# Patient Record
Sex: Female | Born: 1975 | Race: Black or African American | Hispanic: No | Marital: Single | Smoking: Current every day smoker
Health system: Southern US, Community
[De-identification: ages and names within clinical notes are randomized; demographics above are authoritative.]

---

## 1997-06-06 ENCOUNTER — Inpatient Hospital Stay (HOSPITAL_COMMUNITY): Admission: AD | Admit: 1997-06-06 | Discharge: 1997-06-06 | Payer: Self-pay | Admitting: Obstetrics

## 1997-08-06 ENCOUNTER — Inpatient Hospital Stay (HOSPITAL_COMMUNITY): Admission: AD | Admit: 1997-08-06 | Discharge: 1997-08-06 | Payer: Self-pay | Admitting: Obstetrics & Gynecology

## 1999-03-04 ENCOUNTER — Inpatient Hospital Stay (HOSPITAL_COMMUNITY): Admission: AD | Admit: 1999-03-04 | Discharge: 1999-03-04 | Payer: Self-pay | Admitting: Obstetrics & Gynecology

## 1999-04-06 ENCOUNTER — Other Ambulatory Visit: Admission: RE | Admit: 1999-04-06 | Discharge: 1999-04-06 | Payer: Self-pay | Admitting: Obstetrics

## 1999-04-24 ENCOUNTER — Emergency Department (HOSPITAL_COMMUNITY): Admission: EM | Admit: 1999-04-24 | Discharge: 1999-04-24 | Payer: Self-pay | Admitting: Emergency Medicine

## 1999-06-22 ENCOUNTER — Encounter: Payer: Self-pay | Admitting: Emergency Medicine

## 1999-06-22 ENCOUNTER — Emergency Department (HOSPITAL_COMMUNITY): Admission: EM | Admit: 1999-06-22 | Discharge: 1999-06-22 | Payer: Self-pay | Admitting: Emergency Medicine

## 1999-07-19 ENCOUNTER — Emergency Department (HOSPITAL_COMMUNITY): Admission: EM | Admit: 1999-07-19 | Discharge: 1999-07-19 | Payer: Self-pay | Admitting: Emergency Medicine

## 1999-07-19 ENCOUNTER — Encounter: Payer: Self-pay | Admitting: Emergency Medicine

## 1999-08-29 ENCOUNTER — Encounter: Payer: Self-pay | Admitting: Internal Medicine

## 1999-08-29 ENCOUNTER — Emergency Department (HOSPITAL_COMMUNITY): Admission: EM | Admit: 1999-08-29 | Discharge: 1999-08-29 | Payer: Self-pay

## 1999-09-03 ENCOUNTER — Emergency Department (HOSPITAL_COMMUNITY): Admission: EM | Admit: 1999-09-03 | Discharge: 1999-09-03 | Payer: Self-pay | Admitting: Emergency Medicine

## 1999-09-03 ENCOUNTER — Encounter: Payer: Self-pay | Admitting: Emergency Medicine

## 2003-01-08 ENCOUNTER — Emergency Department (HOSPITAL_COMMUNITY): Admission: EM | Admit: 2003-01-08 | Discharge: 2003-01-08 | Payer: Self-pay | Admitting: Emergency Medicine

## 2003-03-18 ENCOUNTER — Emergency Department (HOSPITAL_COMMUNITY): Admission: EM | Admit: 2003-03-18 | Discharge: 2003-03-18 | Payer: Self-pay | Admitting: Emergency Medicine

## 2003-04-25 ENCOUNTER — Emergency Department (HOSPITAL_COMMUNITY): Admission: EM | Admit: 2003-04-25 | Discharge: 2003-04-25 | Payer: Self-pay | Admitting: Emergency Medicine

## 2003-08-20 ENCOUNTER — Emergency Department (HOSPITAL_COMMUNITY): Admission: EM | Admit: 2003-08-20 | Discharge: 2003-08-20 | Payer: Self-pay | Admitting: Emergency Medicine

## 2004-01-09 ENCOUNTER — Emergency Department (HOSPITAL_COMMUNITY): Admission: EM | Admit: 2004-01-09 | Discharge: 2004-01-09 | Payer: Self-pay | Admitting: Emergency Medicine

## 2004-01-27 ENCOUNTER — Emergency Department (HOSPITAL_COMMUNITY): Admission: EM | Admit: 2004-01-27 | Discharge: 2004-01-27 | Payer: Self-pay | Admitting: Family Medicine

## 2004-01-27 ENCOUNTER — Ambulatory Visit (HOSPITAL_COMMUNITY): Admission: RE | Admit: 2004-01-27 | Discharge: 2004-01-27 | Payer: Self-pay | Admitting: Family Medicine

## 2004-03-08 ENCOUNTER — Ambulatory Visit: Payer: Self-pay | Admitting: Internal Medicine

## 2004-06-20 ENCOUNTER — Emergency Department (HOSPITAL_COMMUNITY): Admission: EM | Admit: 2004-06-20 | Discharge: 2004-06-20 | Payer: Self-pay | Admitting: Emergency Medicine

## 2004-07-28 ENCOUNTER — Inpatient Hospital Stay (HOSPITAL_COMMUNITY): Admission: AD | Admit: 2004-07-28 | Discharge: 2004-07-28 | Payer: Self-pay | Admitting: Obstetrics and Gynecology

## 2006-02-22 IMAGING — CR DG CHEST 2V
2 series · 2 of 2 positions shown · non-contrast
Comparison: 08/20/2003.

CLINICAL DATA: Occasional smoker with cough and chest congestion for the past
month.

CHEST - 2 VIEW

[view not recorded (1 of 2)]
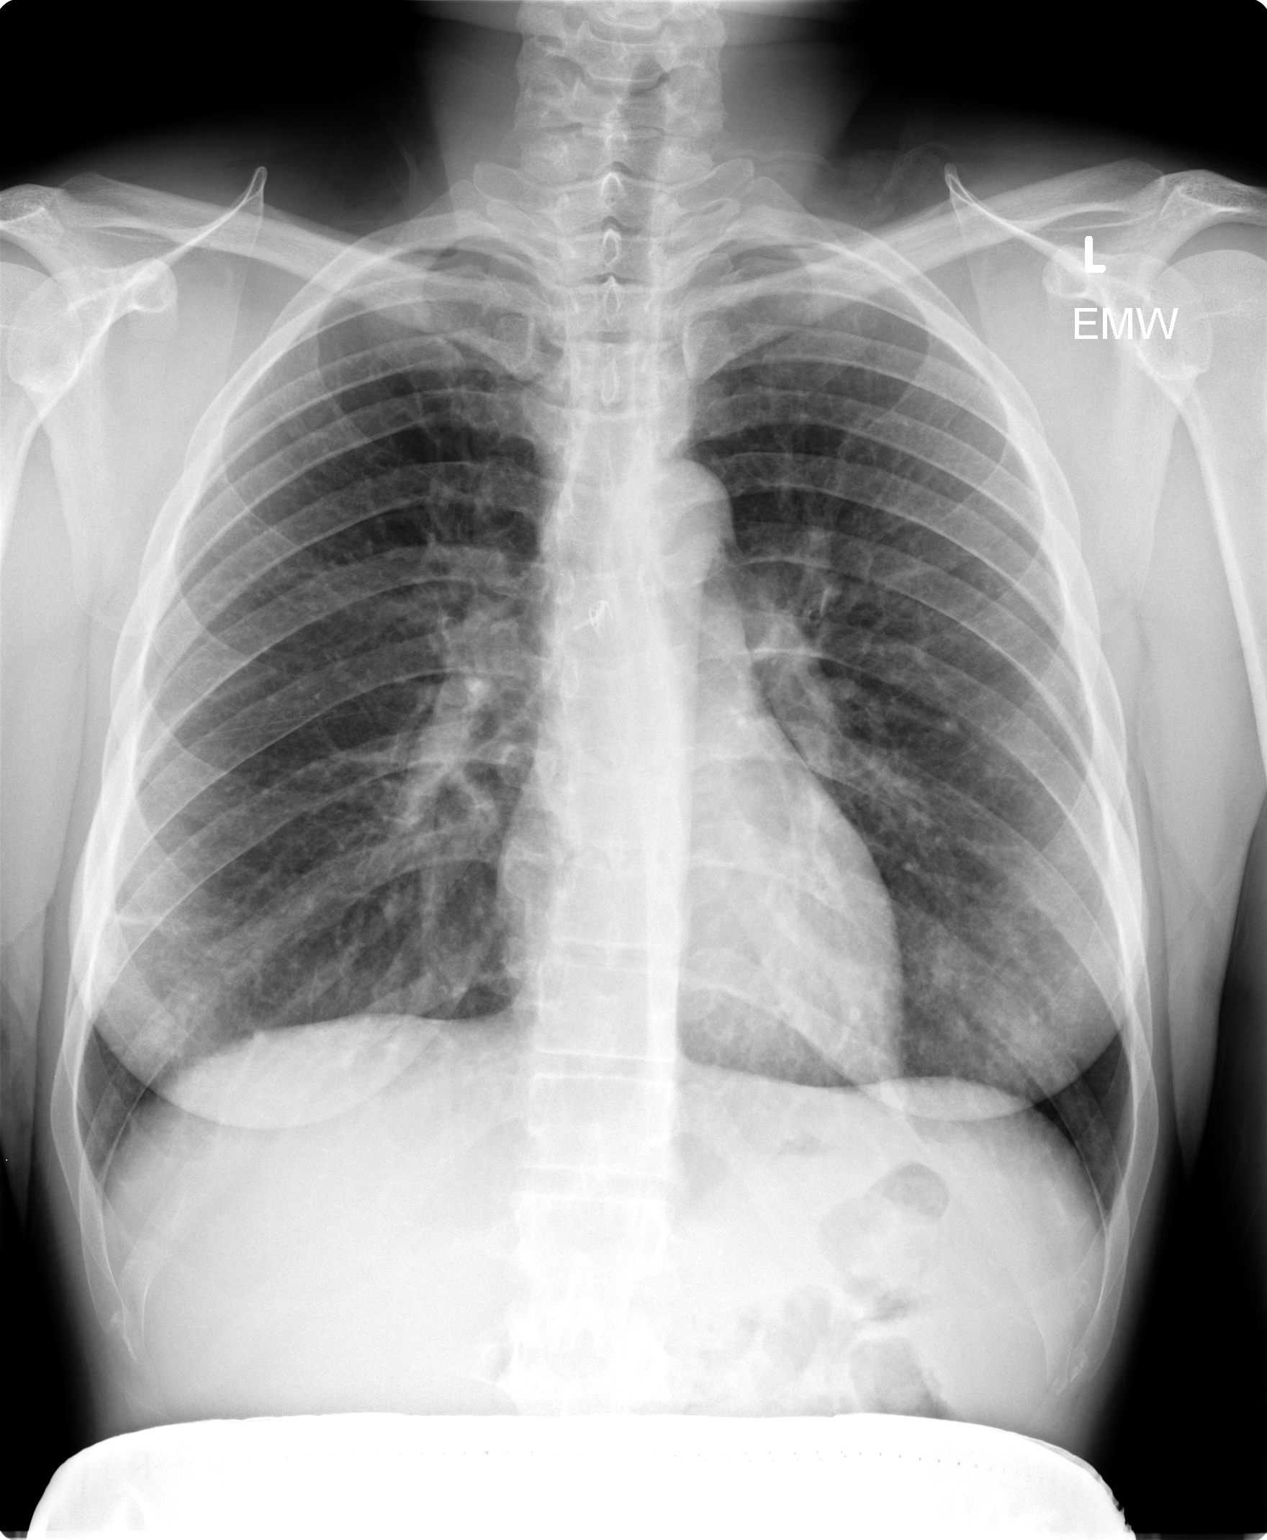

[view not recorded (2 of 2)]
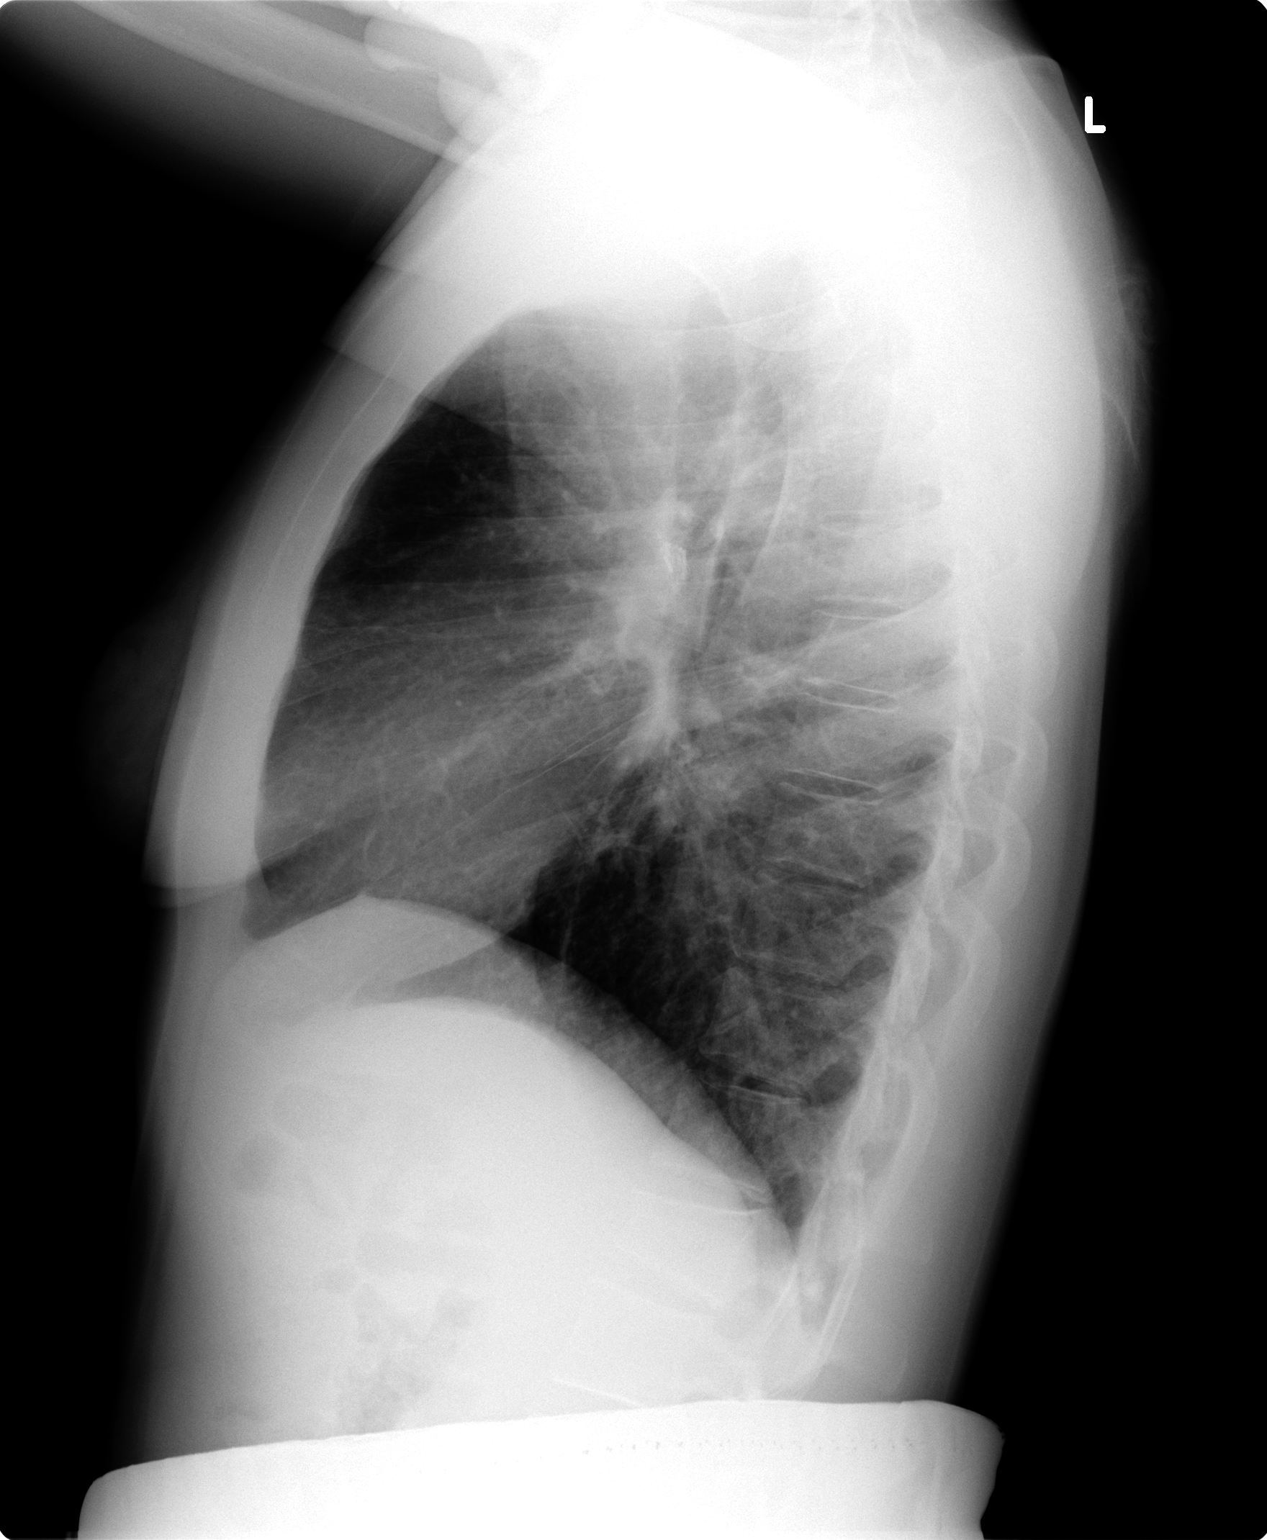

[2 of 2 positions shown; findings below may reference images not displayed]

FINDINGS: Normal sized heart. Stable linear scar formation in the right lower
lung zone laterally. Stable mild diffuse peribronchial thickening. No airspace
consolidation. Stable middle mediastinal surgical clips in the subcarinal
region. Stable minimal scoliosis.

IMPRESSION

Stable mild chronic bronchitic changes. No acute abnormality.

## 2006-09-11 IMAGING — US US OB TRANSVAGINAL MODIFY
1 series · 14 of 28 positions shown · non-contrast
Comparison: none

CLINICAL DATA: Abdominal pain and positive pregnancy test.  
 OBSTETRICAL ULTRASOUND <14 WKS AND TRANSVAGINAL OB US:
TECHNIQUE: Both transabdominal and transvaginal ultrasound examinations were performed for complete evaluation of the gestation as well as the maternal uterus, adnexal regions, and pelvic cul-de-sac.
 An intrauterine gestational sac is identified.  A yolk sac and embryo are apparent although the embryo is quite small.  Embryonic heart rate is measured at 93 bpm.  A small subchorionic hemorrhage is noted.
 The right ovary is unremarkable.  The left ovary contains a 2.6 cm simple cyst compatible with a corpus luteum.  No free fluid is identified in the cul-de-sac.

[Series 1: us ob transvaginal modify · 0.29mm/px · 14 of 30 slices shown]
[im 2/30]
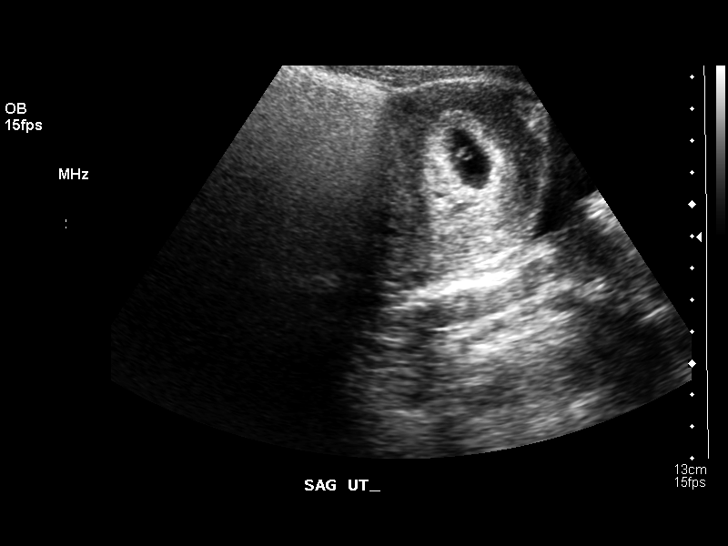
[im 4/30]
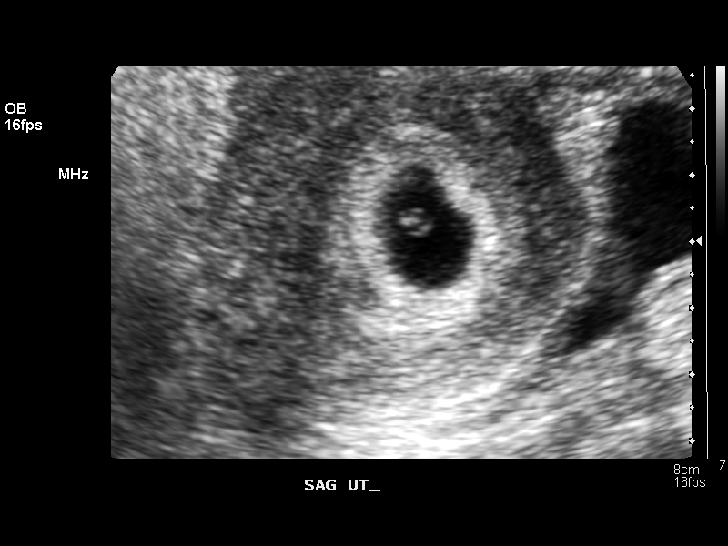
[im 6/30]
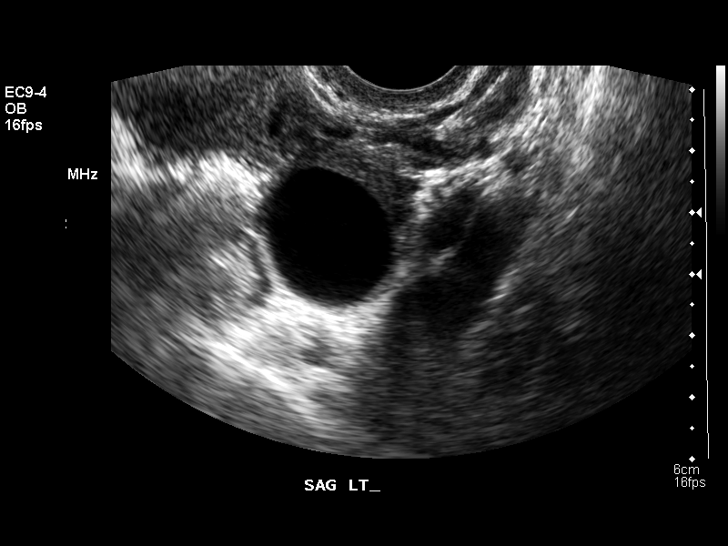
[im 8/30]
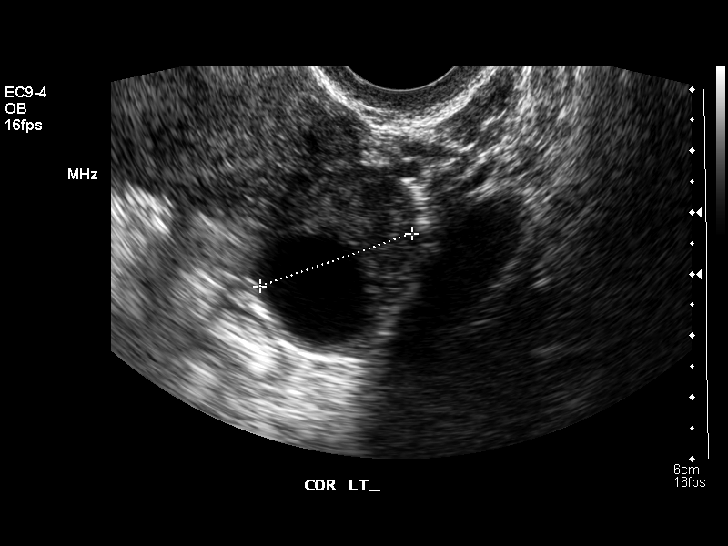
[im 10/30]
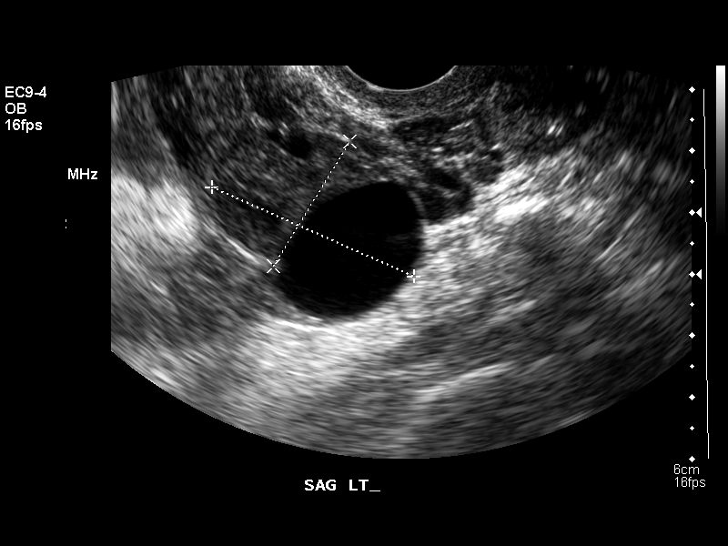
[im 12/30]
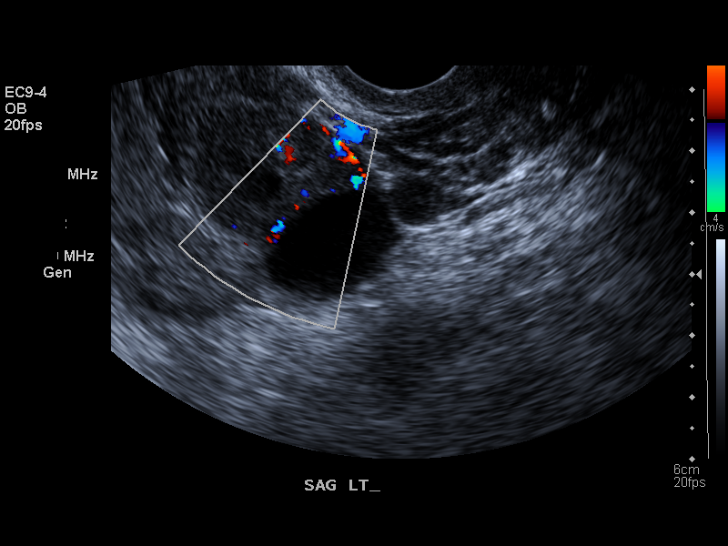
[im 14/30]
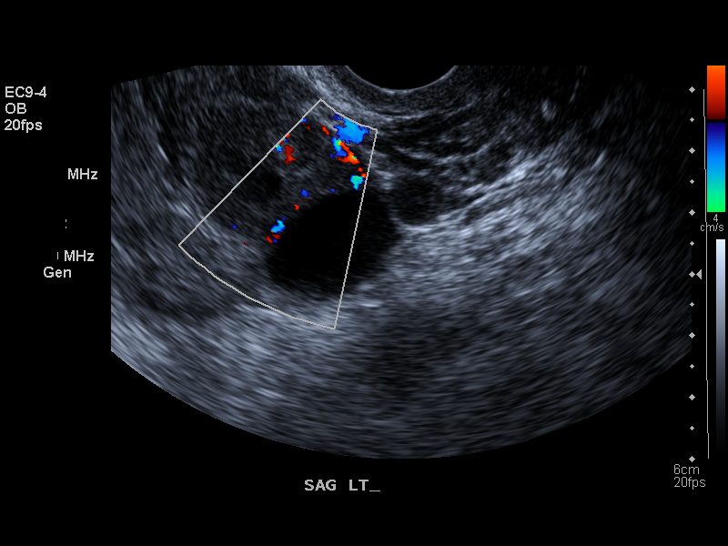
[im 17/30]
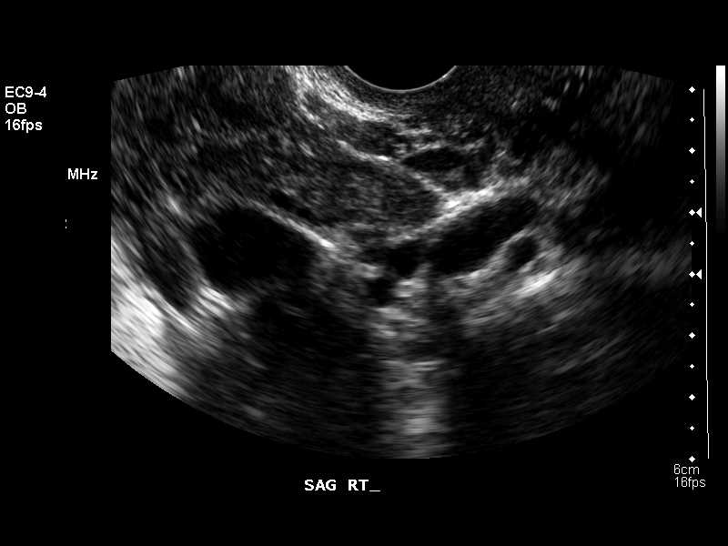
[im 19/30]
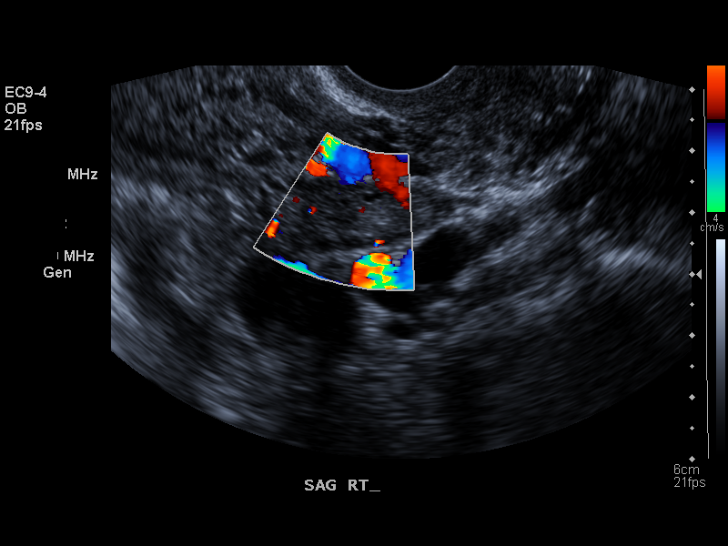
[im 21/30]
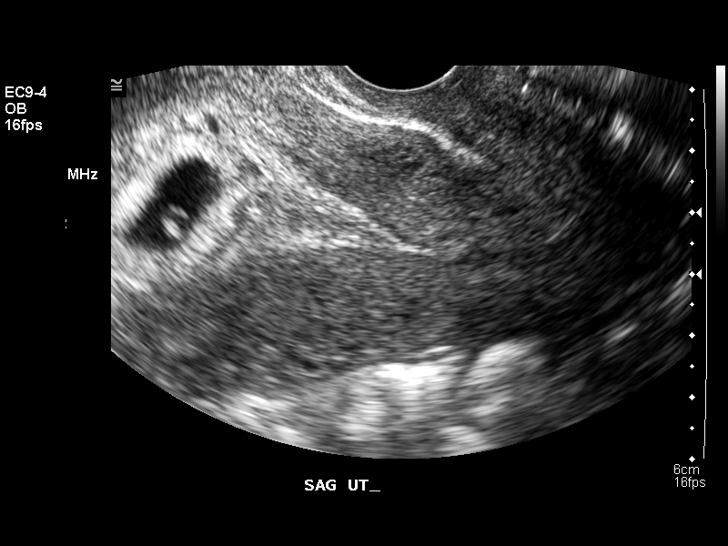
[im 23/30]
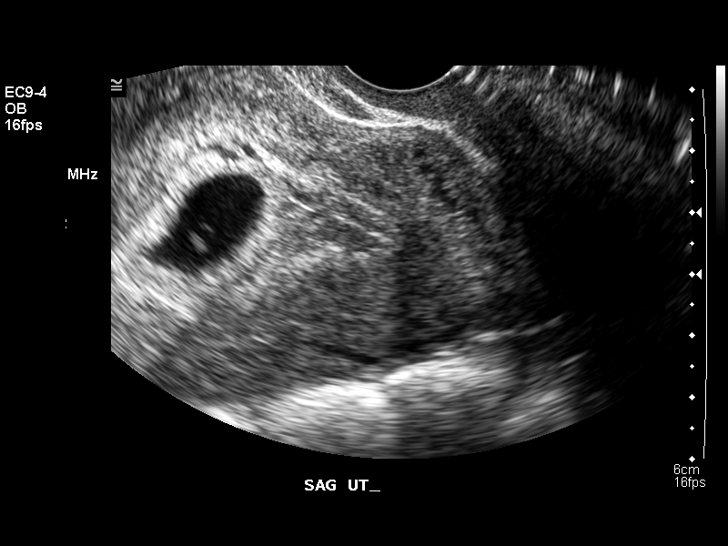
[im 25/30]
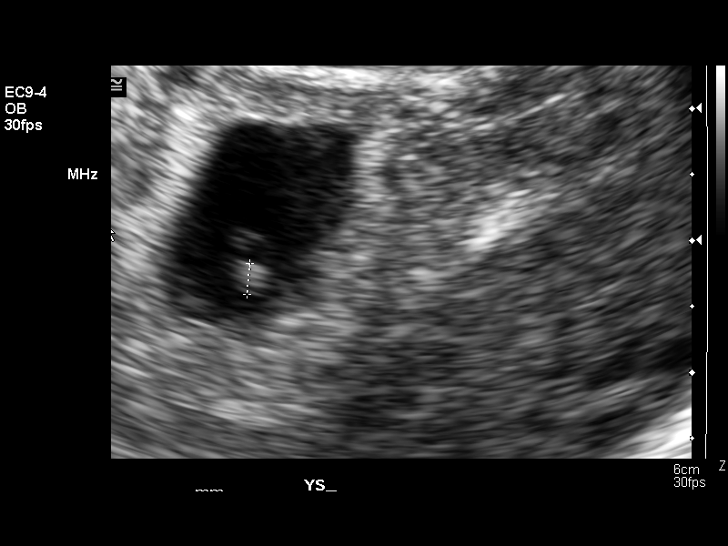
[im 27/30]
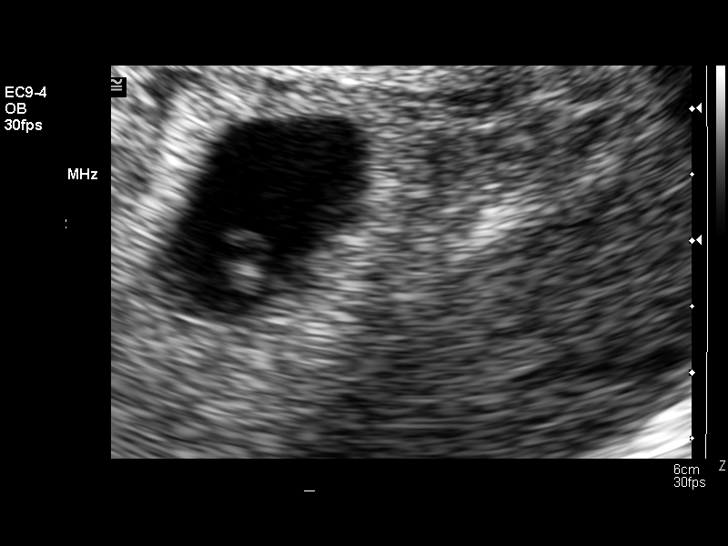
[im 30/30]
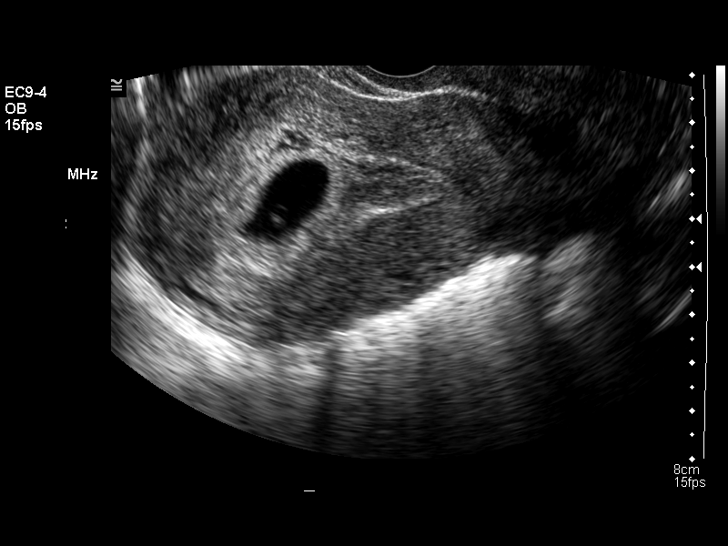

[14 of 28 positions shown; findings below may reference images not displayed]

IMPRESSION: Single living intrauterine gestation at 5 weeks 5 days estimated gestational age by crown rump length of 2.3 mm.

## 2017-10-18 ENCOUNTER — Emergency Department (HOSPITAL_COMMUNITY): Payer: Self-pay

## 2017-10-18 ENCOUNTER — Emergency Department (HOSPITAL_COMMUNITY)
Admission: EM | Admit: 2017-10-18 | Discharge: 2017-10-19 | Disposition: A | Discharge status: Home / Self Care | Payer: Self-pay | Attending: Emergency Medicine | Admitting: Emergency Medicine

## 2017-10-18 ENCOUNTER — Other Ambulatory Visit: Payer: Self-pay

## 2017-10-18 ENCOUNTER — Encounter (HOSPITAL_COMMUNITY): Payer: Self-pay | Admitting: Emergency Medicine

## 2017-10-18 DIAGNOSIS — M940 Chondrocostal junction syndrome [Tietze]: Secondary | ICD-10-CM | POA: Insufficient documentation

## 2017-10-18 DIAGNOSIS — F17210 Nicotine dependence, cigarettes, uncomplicated: Secondary | ICD-10-CM | POA: Insufficient documentation

## 2017-10-18 LAB — CBC
HEMATOCRIT: 40.1 % (ref 36.0–46.0)
Hemoglobin: 13 g/dL (ref 12.0–15.0)
MCH: 28.8 pg (ref 26.0–34.0)
MCHC: 32.4 g/dL (ref 30.0–36.0)
MCV: 88.9 fL (ref 80.0–100.0)
PLATELETS: 314 10*3/uL (ref 150–400)
RBC: 4.51 MIL/uL (ref 3.87–5.11)
RDW: 12.9 % (ref 11.5–15.5)
WBC: 8.1 10*3/uL (ref 4.0–10.5)
nRBC: 0 % (ref 0.0–0.2)

## 2017-10-18 LAB — I-STAT BETA HCG BLOOD, ED (MC, WL, AP ONLY)

## 2017-10-18 LAB — I-STAT TROPONIN, ED: Troponin i, poc: 0 ng/mL (ref 0.00–0.08)
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
## 2017-10-19 LAB — BASIC METABOLIC PANEL
Anion gap: 10 (ref 5–15)
BUN: 8 mg/dL (ref 6–20)
CALCIUM: 9.3 mg/dL (ref 8.9–10.3)
CO2: 26 mmol/L (ref 22–32)
CREATININE: 0.81 mg/dL (ref 0.44–1.00)
Chloride: 103 mmol/L (ref 98–111)
GFR calc Af Amer: >60 mL/min (ref >60)
GFR calc non Af Amer: >60 mL/min (ref >60)
GLUCOSE: 73 mg/dL (ref 70–99)
Potassium: 3.4 mmol/L — ABNORMAL LOW (ref 3.5–5.1)
Sodium: 139 mmol/L (ref 135–145)

## 2017-10-19 MED ORDER — IBUPROFEN 400 MG PO TABS
600.0000 mg | ORAL_TABLET | Freq: Once | ORAL | Status: DC
  Filled 2017-10-19: qty 1

## 2017-10-19 MED ORDER — IBUPROFEN 600 MG PO TABS: 600.0000 mg | ORAL_TABLET | Freq: Four times a day (QID) | ORAL | 0 refills | Status: AC | PRN

## 2017-10-19 NOTE — Discharge Instructions (Addendum)
Take ibuprofen as prescribed and apply heat for comfort. Return to the emergency department if you develop any cough, severe pain, high fever or new concern.

## 2017-10-19 NOTE — ED Notes (Signed)
Pt refusing 5-lead. Removes wires. 'until she sees doctor'.

## 2017-10-19 NOTE — ED Provider Notes (Signed)
[facility] EMERGENCY DEPARTMENT Provider Note   CSN: [id number] Arrival date & time: 10/18/17  2229     History   Chief Complaint Chief Complaint  Patient presents with  . Chest Pain    HPI Marissa Higgins is a 42 y.o. female.  Patient here for evaluation of left sided chest pain along the sternal border for the past 2 days. It has been constant. No SOB or cough, no fever. The pain is worse with movement and certain positions. No injury to the chest that she is aware of. No nausea or vomiting. No abdominal pain.  The history is provided by the patient. No language interpreter was used.  Chest Pain   Pertinent negatives include no fever, no nausea and no shortness of breath.    History reviewed. No pertinent past medical history.  There are no active problems to display for this patient.   History reviewed. No pertinent surgical history.   OB History   None      Home Medications    Prior to Admission medications   Medication Sig Start Date End Date Taking? Authorizing Provider  tiZANidine (ZANAFLEX) 2 MG tablet Take 2 mg by mouth every 6 (six) hours as needed for muscle spasms.   Yes [provider]    Family History No family history on file.  Social History Social History   Tobacco Use  . Smoking status: Current Every Day Smoker    Packs/day: 0.50    Types: Cigarettes  . Smokeless tobacco: Never Used  Substance Use Topics  . Alcohol use: Never    Frequency: Never  . Drug use: Never     Allergies   Demerol [meperidine hcl]   Review of Systems Review of Systems  Constitutional: Negative for chills and fever.  HENT: Negative.   Respiratory: Negative.  Negative for shortness of breath.   Cardiovascular: Positive for chest pain.  Gastrointestinal: Negative.  Negative for nausea.  Musculoskeletal: Negative.  Negative for myalgias.  Skin: Negative.   Neurological: Negative.      Physical Exam Updated Vital  Signs BP 121/82   Pulse 68   Temp 98.6 F (37 C) (Oral)   Resp 20   Ht 5\' 3"  (1.6 m)   Wt 61.2 kg   LMP 10/08/2017   SpO2 100%   BMI 23.91 kg/m   Physical Exam  Constitutional: She is oriented to person, place, and time. She appears well-developed and well-nourished.  HENT:  Head: Normocephalic.  Neck: Normal range of motion. Neck supple.  Cardiovascular: Normal rate and regular rhythm.  No murmur heard. Pulmonary/Chest: Effort normal and breath sounds normal. She has no wheezes. She has no rhonchi. She has no rales.  Tenderness to palpation of left sternal border. No swelling.   Abdominal: Soft. Bowel sounds are normal. There is no tenderness. There is no rebound and no guarding.  Musculoskeletal: Normal range of motion.  Neurological: She is alert and oriented to person, place, and time.  Skin: Skin is warm and dry. No rash noted.  Psychiatric: She has a normal mood and affect.     ED Treatments / Results  Labs (all labs ordered are listed, but only abnormal results are displayed) Labs Reviewed  BASIC METABOLIC PANEL - Abnormal; Notable for the following components:      Result Value   Potassium 3.4 (*)    All other components within normal limits  CBC  I-STAT TROPONIN, ED  I-STAT BETA HCG BLOOD, ED ([facility],  [facility], [facility] ONLY)   Results for orders placed or performed during the hospital encounter of 10/18/17  Basic metabolic panel  Result Value Ref Range   Sodium 139 135 - 145 mmol/L   Potassium 3.4 (L) 3.5 - 5.1 mmol/L   Chloride 103 98 - 111 mmol/L   CO2 26 22 - 32 mmol/L   Glucose, Bld 73 70 - 99 mg/dL   BUN 8 6 - 20 mg/dL   Creatinine, Ser 1.61 0.44 - 1.00 mg/dL   Calcium 9.3 8.9 - 09.6 mg/dL   GFR calc non Af Amer >60 >60 mL/min   GFR calc Af Amer >60 >60 mL/min   Anion gap 10 5 - 15  CBC  Result Value Ref Range   WBC 8.1 4.0 - 10.5 K/uL   RBC 4.51 3.87 - 5.11 MIL/uL   Hemoglobin 13.0 12.0 - 15.0 g/dL   HCT 04.5 40.9 - 81.1 %   MCV 88.9 80.0 - 100.0 fL    MCH 28.8 26.0 - 34.0 pg   MCHC 32.4 30.0 - 36.0 g/dL   RDW 91.4 78.2 - 95.6 %   Platelets 314 150 - 400 K/uL   nRBC 0.0 0.0 - 0.2 %  I-stat troponin, ED  Result Value Ref Range   Troponin i, poc 0.00 0.00 - 0.08 ng/mL   Comment 3          I-Stat beta hCG blood, ED  Result Value Ref Range   I-stat hCG, quantitative <5.0 <5 mIU/mL   Comment 3             EKG None  Radiology Dg Chest 2 View  Result Date: 10/18/2017 CLINICAL DATA:  Chest pain for 1 day. EXAM: CHEST - 2 VIEW COMPARISON:  Radiograph 01/09/2004 FINDINGS: The cardiomediastinal contours are normal. Subcarinal surgical clips again seen. Minimal scarring at the right lung base. Pulmonary vasculature is normal. No consolidation, pleural effusion, or pneumothorax. No acute osseous abnormalities are seen. Mild chronic compression fracture at the thoracolumbar junction, tentatively identified T12. IMPRESSION: No acute findings. Electronically Signed   By: Narda Rutherford M.D.   On: 10/18/2017 23:25    Procedures Procedures (including critical care time)  Medications Ordered in ED Medications - No data to display   Initial Impression / Assessment and Plan / ED Course  I have reviewed the triage vital signs and the nursing notes.  Pertinent labs & imaging results that were available during my care of the patient were reviewed by me and considered in my medical decision making (see chart for details).     Patient here with left sided chest pain x 2 days without fever, SOB, vomiting.   There is reproducible tenderness on exam c/w costochondritis. Will start ibuprofen and recommend heat. Return precautions discussed.   Final Clinical Impressions(s) / ED Diagnoses   Final diagnoses:  None   1. Costochondritis   ED Discharge Orders    None       Elpidio Anis, PA-C 10/19/17 0124    Geoffery Lyons, MD 10/19/17 [phone]

## 2019-12-02 IMAGING — CR DG CHEST 2V
2 series · 2 of 2 positions shown · non-contrast
Comparison: Radiograph 01/09/2004

CLINICAL DATA: Chest pain for 1 day.

EXAM:
CHEST - 2 VIEW

[chest pa]
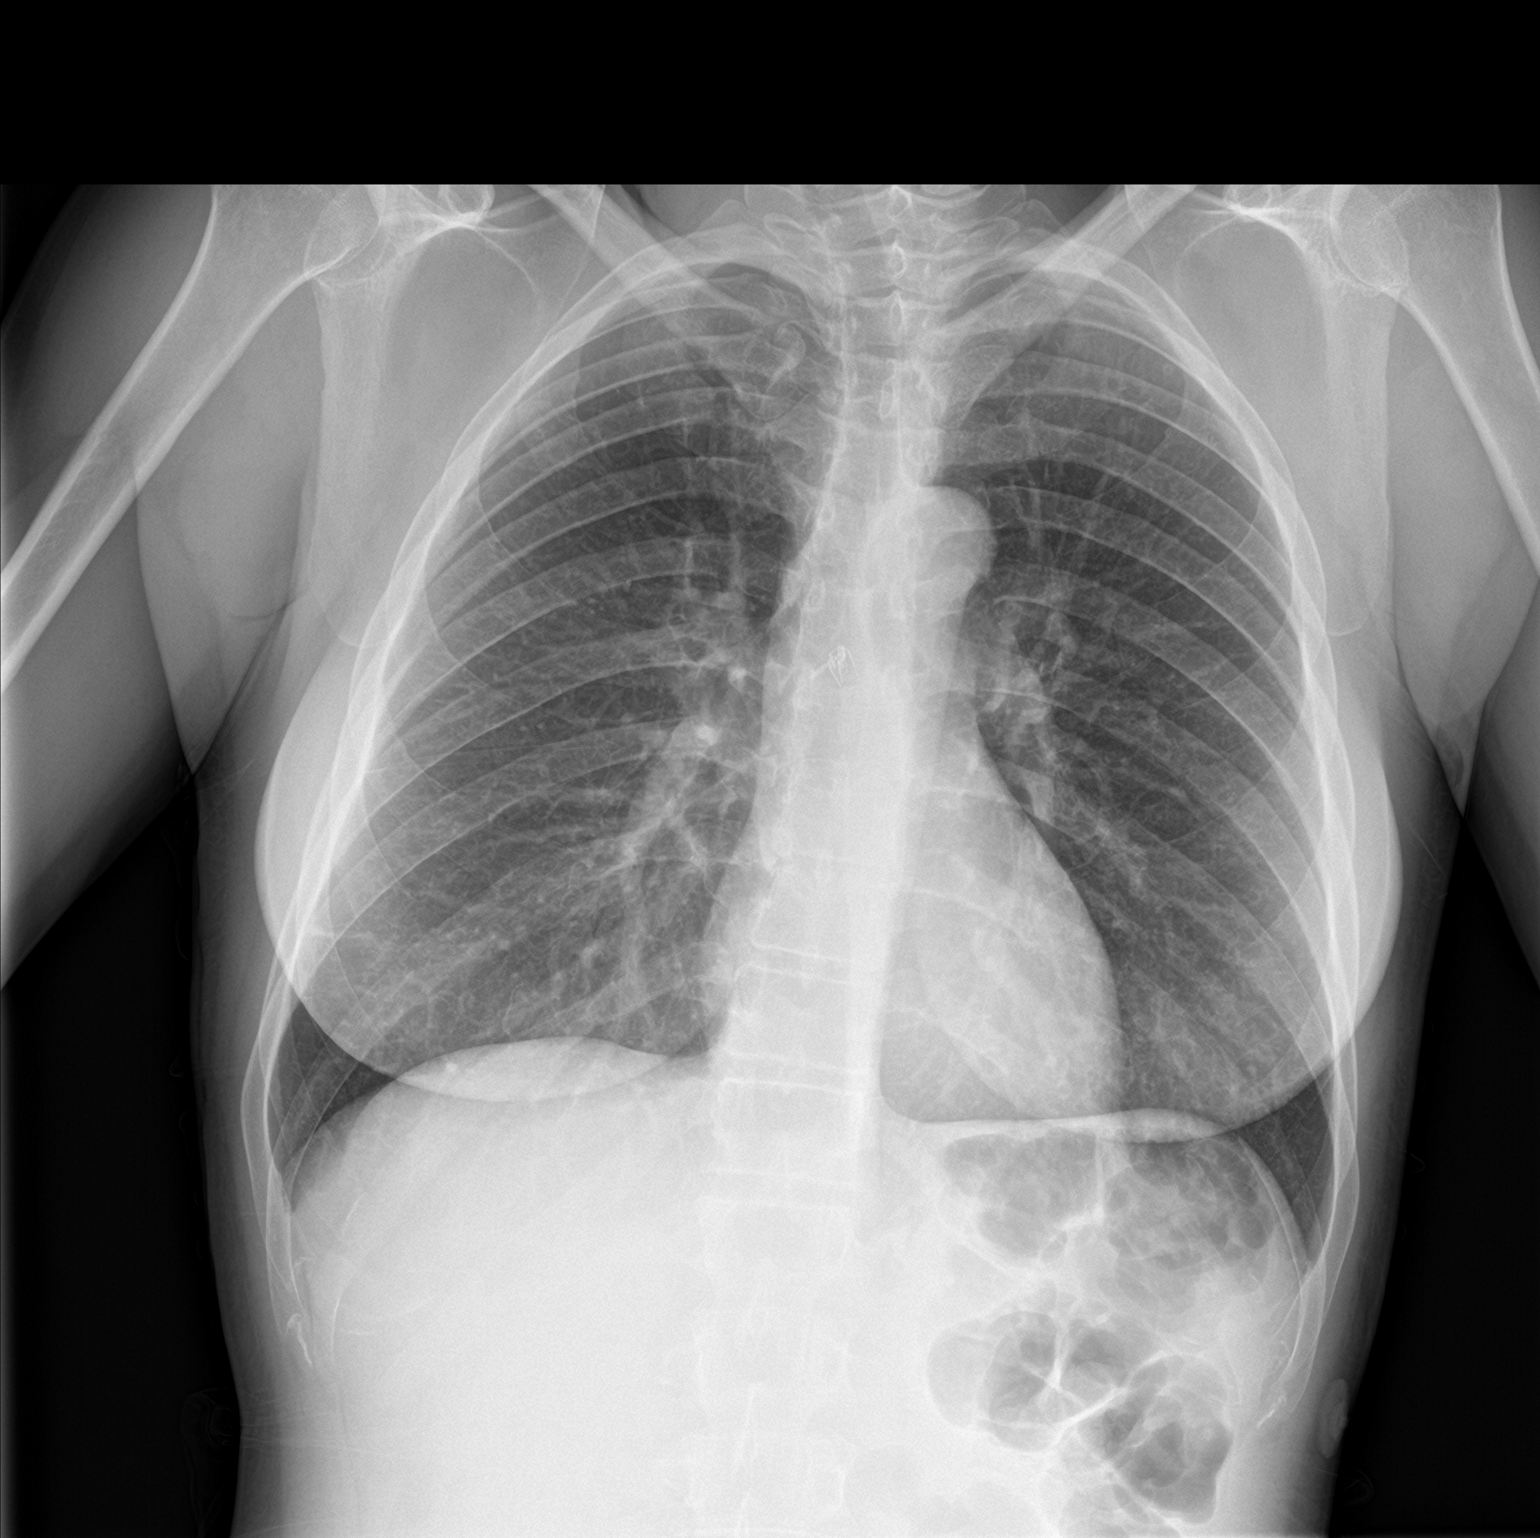

[chest lat]
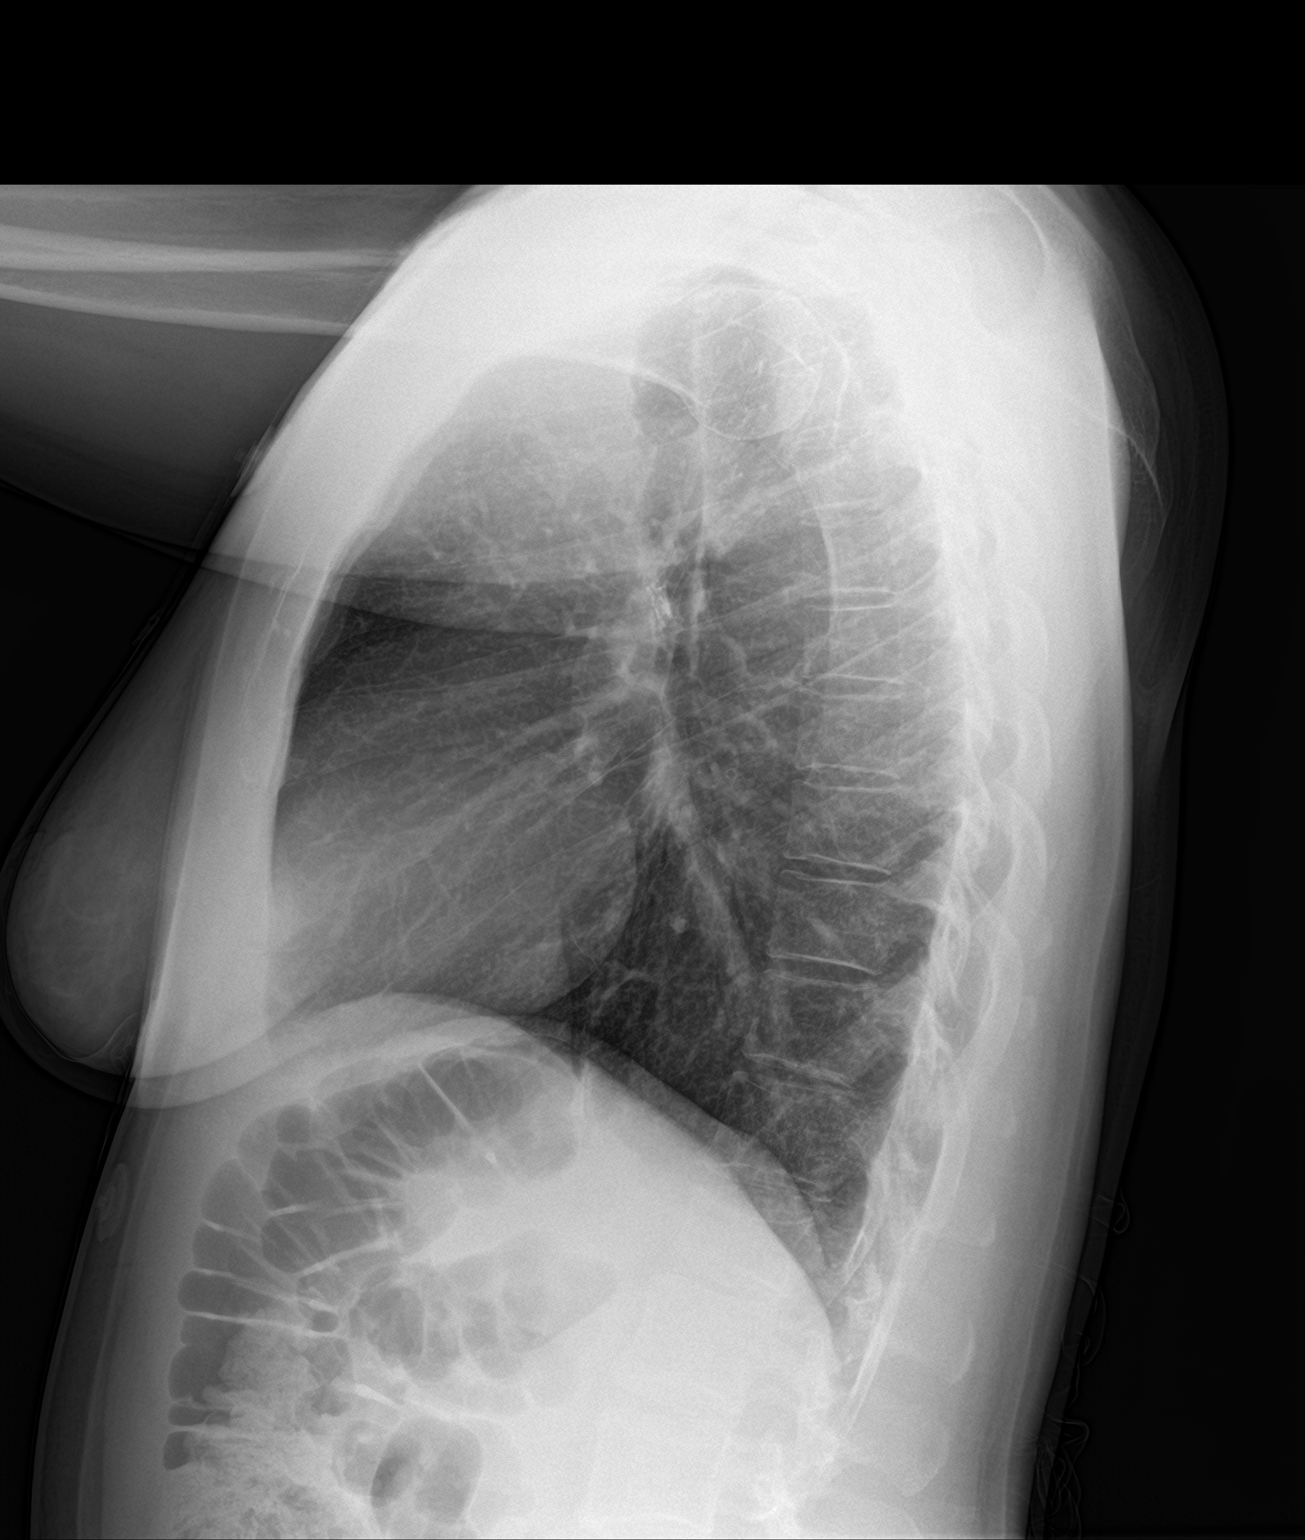

[2 of 2 positions shown; findings below may reference images not displayed]

FINDINGS: The cardiomediastinal contours are normal. Subcarinal surgical clips
again seen. Minimal scarring at the right lung base. Pulmonary
vasculature is normal. No consolidation, pleural effusion, or
pneumothorax. No acute osseous abnormalities are seen. Mild chronic
compression fracture at the thoracolumbar junction, tentatively
identified T12.
IMPRESSION: No acute findings.
